# Patient Record
Sex: Female | Born: 1970 | Race: White | Hispanic: No | Marital: Married | Smoking: Never smoker
Health system: Southern US, Community
[De-identification: ages and names within clinical notes are randomized; demographics above are authoritative.]

## PROBLEM LIST (undated history)

## (undated) HISTORY — PX: OTHER SURGICAL HISTORY: SHX169

## (undated) HISTORY — PX: COSMETIC SURGERY: SHX468

---

## 2003-04-06 ENCOUNTER — Ambulatory Visit (HOSPITAL_COMMUNITY): Admission: RE | Admit: 2003-04-06 | Discharge: 2003-04-06 | Payer: Self-pay | Admitting: Obstetrics and Gynecology

## 2003-04-06 ENCOUNTER — Encounter: Payer: Self-pay | Admitting: Obstetrics and Gynecology

## 2003-06-29 ENCOUNTER — Encounter: Admission: RE | Admit: 2003-06-29 | Discharge: 2003-06-29 | Payer: Self-pay | Admitting: Obstetrics and Gynecology

## 2003-06-30 ENCOUNTER — Inpatient Hospital Stay (HOSPITAL_COMMUNITY): Admission: AD | Admit: 2003-06-30 | Discharge: 2003-07-02 | Payer: Self-pay | Admitting: Obstetrics and Gynecology

## 2003-07-12 ENCOUNTER — Encounter: Admission: RE | Admit: 2003-07-12 | Discharge: 2003-08-11 | Payer: Self-pay | Admitting: Obstetrics and Gynecology

## 2003-08-08 ENCOUNTER — Other Ambulatory Visit: Admission: RE | Admit: 2003-08-08 | Discharge: 2003-08-08 | Payer: Self-pay | Admitting: Obstetrics and Gynecology

## 2003-08-12 ENCOUNTER — Encounter: Admission: RE | Admit: 2003-08-12 | Discharge: 2003-09-11 | Payer: Self-pay | Admitting: Obstetrics and Gynecology

## 2003-11-12 ENCOUNTER — Encounter: Admission: RE | Admit: 2003-11-12 | Discharge: 2003-12-12 | Payer: Self-pay | Admitting: Obstetrics and Gynecology

## 2010-03-30 ENCOUNTER — Ambulatory Visit: Payer: Self-pay | Admitting: Emergency Medicine

## 2010-03-30 DIAGNOSIS — J45909 Unspecified asthma, uncomplicated: Secondary | ICD-10-CM | POA: Insufficient documentation

## 2010-03-30 DIAGNOSIS — IMO0002 Reserved for concepts with insufficient information to code with codable children: Secondary | ICD-10-CM

## 2010-04-04 ENCOUNTER — Ambulatory Visit: Payer: Self-pay | Admitting: Emergency Medicine

## 2010-04-04 DIAGNOSIS — L255 Unspecified contact dermatitis due to plants, except food: Secondary | ICD-10-CM

## 2010-10-30 NOTE — Assessment & Plan Note (Signed)
Summary: SKIN INFECTION/KH   Vital Signs:  Patient Profile:   40 Years Old Female CC:      infections to bilateral FAs X 11 days Height:     66 inches Weight:      186 pounds O2 Sat:      99 % O2 treatment:    Room Air Temp:     98.0 degrees F oral Pulse rate:   75 / minute Resp:     14 per minute BP sitting:   118 / 60  (left arm) Cuff size:   regular  Vitals Entered By: Lajean Saver RN (March 30, 2010 9:02 AM)                  Updated Prior Medication List: FLUOXETINE HCL 20 MG CAPS (FLUOXETINE HCL)   Current Allergies: ! PCNHistory of Present Illness Chief Complaint: infections to bilateral FAs X 11 days History of Present Illness: Infection to both forearms for 1-2 weeks.  Was out in her yard gardening and then this started on her right arms, then recently spread to left arm.  ?poison ivy.  Very itchy, red, swollen. OTC benedryl helps.  OTC itch creams help.  REVIEW OF SYSTEMS Constitutional Symptoms      Denies fever, chills, night sweats, weight loss, weight gain, and fatigue.  Eyes       Denies change in vision, eye pain, eye discharge, glasses, contact lenses, and eye surgery. Ear/Nose/Throat/Mouth       Denies hearing loss/aids, change in hearing, ear pain, ear discharge, dizziness, frequent runny nose, frequent nose bleeds, sinus problems, sore throat, hoarseness, and tooth pain or bleeding.  Respiratory       Denies dry cough, productive cough, wheezing, shortness of breath, asthma, bronchitis, and emphysema/COPD.  Cardiovascular       Denies murmurs, chest pain, and tires easily with exhertion.    Gastrointestinal       Denies stomach pain, nausea/vomiting, diarrhea, constipation, blood in bowel movements, and indigestion. Genitourniary       Denies painful urination, kidney stones, and loss of urinary control. Neurological       Complains of headaches.      Denies paralysis, seizures, and fainting/blackouts.      Comments: started  today Musculoskeletal       Denies muscle pain, joint pain, joint stiffness, decreased range of motion, redness, swelling, muscle weakness, and gout.  Skin       Complains of unusual moles/lumps or sores.      Denies bruising and hair/skin or nail changes.      Comments: bilateral forearms Psych       Denies mood changes, temper/anger issues, anxiety/stress, speech problems, depression, and sleep problems. Other Comments: patient noticed places to forearms after working in her yard about 11 days ago. 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  Left forearm erythema in 2  spots about 2-3cm each. Assessment New Problems: CELLULITIS, ARM (ICD-682.3) FAMILY HISTORY DIABETES 1ST DEGREE RELATIVE (ICD-V18.0) FAMILY HISTORY OF COLON CA 1ST DEGREE RELATIVE <60 (ICD-V16.0) ASTHMA (ICD-493.90)   Plan New Orders: New Patient Level III [99203] Planning Comments:   Rx for Bactrim DS by mouth two times a day for 10 days, no refills   The patient and/or caregiver has been counseled thoroughly with regard to medications prescribed including dosage, schedule, interactions, rationale for use, and possible side effects and they verbalize understanding.  Diagnoses and expected course of recovery  discussed and will return if not improved as expected or if the condition worsens. Patient and/or caregiver verbalized understanding.   Patient Instructions: 1)  Keep clean and dry 2)  Take antibiotics 3)  Can use topical benedryl for itch or mild steroid cream 4)  If worsening or fever, return to clinic  Orders Added: 1)  New Patient Level III [16109]

## 2010-10-30 NOTE — Assessment & Plan Note (Signed)
Summary: Rash is not getting any better rm 3   Vital Signs:  Patient Profile:   40 Years Old Female CC:      Rash - Pt states is getting no better Height:     66 inches Weight:      187 pounds O2 Sat:      100 % O2 treatment:    Room Air Temp:     97.9 degrees F oral Pulse rate:   71 / minute Pulse rhythm:   regular Resp:     16 per minute BP sitting:   108 / 75  (right arm) Cuff size:   regular  Vitals Entered By: Areta Haber CMA (April 04, 2010 4:27 PM)                  Current Allergies: ! PCN     History of Present Illness Chief Complaint: Rash - Pt states is getting no better History of Present Illness: She is here to follow up on her bilateral forearm rashes.  She is taking the Bactrim and the redness is getting better, but the skin is still irritated and itchy.  Caladryl lotion helps.  She states that some parts are getting better but other parts aren't.  Current Problems: POISON IVY DERMATITIS (ICD-692.6) CELLULITIS, ARM (ICD-682.3) FAMILY HISTORY DIABETES 1ST DEGREE RELATIVE (ICD-V18.0) FAMILY HISTORY OF COLON CA 1ST DEGREE RELATIVE <60 (ICD-V16.0) ASTHMA (ICD-493.90)   Current Meds FLUOXETINE HCL 20 MG CAPS (FLUOXETINE HCL)  BACTRIM DS 800-160 MG TABS (SULFAMETHOXAZOLE-TRIMETHOPRIM) 1 tab by mouth two times a day for 10 days CLOBETASOL PROPIONATE 0.05 % CREA (CLOBETASOL PROPIONATE) apply to affected areas two times a day as needed for rash  REVIEW OF SYSTEMS Constitutional Symptoms      Denies fever, chills, night sweats, weight loss, weight gain, and fatigue.  Eyes       Denies change in vision, eye pain, eye discharge, glasses, contact lenses, and eye surgery. Ear/Nose/Throat/Mouth       Denies hearing loss/aids, change in hearing, ear pain, ear discharge, dizziness, frequent runny nose, frequent nose bleeds, sinus problems, sore throat, hoarseness, and tooth pain or bleeding.  Respiratory       Denies dry cough, productive cough, wheezing,  shortness of breath, asthma, bronchitis, and emphysema/COPD.  Cardiovascular       Denies murmurs, chest pain, and tires easily with exhertion.    Gastrointestinal       Denies stomach pain, nausea/vomiting, diarrhea, constipation, blood in bowel movements, and indigestion. Genitourniary       Denies painful urination, kidney stones, and loss of urinary control. Neurological       Denies paralysis, seizures, and fainting/blackouts. Musculoskeletal       Denies muscle pain, joint pain, joint stiffness, decreased range of motion, redness, swelling, muscle weakness, and gout.  Skin       Denies bruising, unusual mles/lumps or sores, and hair/skin or nail changes.      Comments: L arm is no better. Psych       Denies mood changes, temper/anger issues, anxiety/stress, speech problems, depression, and sleep problems. Other Comments: Pt states she is still having alot of itching. Pt does not have a PCP.   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  Left forearm erythema is gone but there are raised lesions c/w contact derm.  No signs of infection, but perhaps still continued  poison ivy new lesions. Assessment New Problems: POISON IVY DERMATITIS (ICD-692.6)   Plan New Medications/Changes: CLOBETASOL PROPIONATE 0.05 % CREA (CLOBETASOL PROPIONATE) apply to affected areas two times a day as needed for rash  #QS x2 wks x 0, 04/04/2010, Hoyt Koch MD  New Orders: Est. Patient Level II [phone]  The patient and/or caregiver has been counseled thoroughly with regard to medications prescribed including dosage, schedule, interactions, rationale for use, and possible side effects and they verbalize understanding.  Diagnoses and expected course of recovery discussed and will return if not improved as expected or if the condition worsens. Patient and/or caregiver verbalized understanding.  Prescriptions: CLOBETASOL PROPIONATE 0.05 % CREA (CLOBETASOL PROPIONATE) apply to affected areas two times a day as needed for rash  #QS x2 wks x 0   Entered and Authorized by:   Hoyt Koch MD   Signed by:   Hoyt Koch MD on 04/04/2010   Method used:   Printed then faxed to ...       [facility] [phone]* (retail)       [address] [place], [place]  [address]       Ph: [phone]       Fax: [phone]   RxID:   [id number]   Patient Instructions: 1)  Continue the antibiotics until they are gone 2)  Use the steroid cream to decrease the inflammation and redness and itching 3)  If not improving in 5-7 more days, make appt with dermatology (phone number given) 4)  Contact info given for PCP 5)  Cool compresses  Orders Added: 1)  Est. Patient Level II [35573]

## 2010-12-14 ENCOUNTER — Inpatient Hospital Stay (INDEPENDENT_AMBULATORY_CARE_PROVIDER_SITE_OTHER)
Admission: RE | Admit: 2010-12-14 | Discharge: 2010-12-14 | Disposition: A | Discharge status: Home / Self Care | Payer: BC Managed Care – PPO | Source: Ambulatory Visit | Attending: Emergency Medicine | Admitting: Emergency Medicine

## 2010-12-14 ENCOUNTER — Encounter: Payer: Self-pay | Admitting: Emergency Medicine

## 2010-12-14 DIAGNOSIS — J029 Acute pharyngitis, unspecified: Secondary | ICD-10-CM

## 2010-12-17 ENCOUNTER — Telehealth (INDEPENDENT_AMBULATORY_CARE_PROVIDER_SITE_OTHER): Payer: Self-pay | Admitting: *Deleted

## 2010-12-18 NOTE — Assessment & Plan Note (Signed)
Summary: POSSIBLE STREP   Vital Signs:  Patient Profile:   40 Years Old Female CC:      strep test, fatigue, ear pain Height:     66 inches Weight:      145 pounds O2 Sat:      98 % O2 treatment:    Room Air Temp:     99.4 degrees F oral Pulse rate:   78 / minute Resp:     14 per minute BP sitting:   99 / 65  (left arm) Cuff size:     regular  Vitals Entered By: Lajean Saver RN (December 14, 2010 3:33 PM)                  Updated Prior Medication List: FLUOXETINE HCL 20 MG CAPS (FLUOXETINE HCL)   Current Allergies (reviewed today): ! PCNHistory of Present Illness History from: patient Chief Complaint: strep test, fatigue, ear pain History of Present Illness: Pt complains of 1 days of sore throat and fatigue. Son has + strep. Rare np cough, No dyspnea. No chest pain. No wheezing.  No nausea No vomiting. ? mild fever, No chills  Denies chance of pregnancy  REVIEW OF SYSTEMS Constitutional Symptoms       Complains of fatigue.     Denies fever, chills, night sweats, weight loss, and weight gain.  Eyes       Denies change in vision, eye pain, eye discharge, glasses, contact lenses, and eye surgery. Ear/Nose/Throat/Mouth       Complains of ear pain.      Denies hearing loss/aids, change in hearing, ear discharge, dizziness, frequent runny nose, frequent nose bleeds, sinus problems, sore throat, hoarseness, and tooth pain or bleeding.  Respiratory       Complains of dry cough.      Denies productive cough, wheezing, shortness of breath, asthma, bronchitis, and emphysema/COPD.  Cardiovascular       Denies murmurs, chest pain, and tires easily with exhertion.    Gastrointestinal       Denies stomach pain, nausea/vomiting, diarrhea, constipation, blood in bowel movements, and indigestion. Genitourniary       Denies painful urination, kidney stones, and loss of urinary control. Neurological       Denies paralysis, seizures, and fainting/blackouts. Musculoskeletal   Denies muscle pain, joint pain, joint stiffness, decreased range of motion, redness, swelling, muscle weakness, and gout.  Skin       Denies bruising, unusual mles/lumps or sores, and hair/skin or nail changes.  Psych       Denies mood changes, temper/anger issues, anxiety/stress, speech problems, depression, and sleep problems. Other Comments: Patient would like to be tested for strep, her son is positive. She is feeling fatigues and c/o some ear pain. Denies sore throat but it does "feel tight"   Past History:  Past Medical History: Reviewed history from 03/30/2010 and no changes required. Asthma  Past Surgical History: Reviewed history from 03/30/2010 and no changes required. right knee 1994 instant tear ducts  Family History: Reviewed history from 03/30/2010 and no changes required. 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 Alcohol use-yes, 3/week Drug use-no Regular exercise-no Physical Exam General appearance: well developed, well nourished, no acute distress Head: normocephalic, atraumatic Eyes: conjunctivae and lids normal Ears: normal, no lesions or deformities Nasal: mucosa pink, nonedematous, no septal deviation, turbinates normal Oral/Pharynx: pharyngeal erythema with exudate, uvula midline without deviation Neck: supple,anterior lymphadenopathy present Chest/Lungs: no  rales, wheezes, or rhonchi bilateral, breath sounds equal without effort Heart: regular rate and  rhythm, no murmur Skin: no obvious rashes or lesions Assessment New Problems: ACUTE PHARYNGITIS (ICD-462)  By hx, suspect strep.  Patient Education: Patient and/or caregiver instructed in the following: rest, fluids, Tylenol prn.  Plan New Medications/Changes: ZITHROMAX Z-PAK 250 MG TABS (AZITHROMYCIN) take as directed  #1 Pack x 1, 12/14/2010, Lajean Manes MD  New Orders: Rapid Strep [16109] Est. Patient Level  III [99213] T-Culture, Throat [[id number]] Planning Comments:   rx options discussed. Risks, benefits, alternatives discussed. Pt voiced understanding and agreement. Pt prefers to start zpack now, and send off throat cx  Follow Up: Follow up in 2-3 days if no improvement  The patient and/or caregiver has been counseled thoroughly with regard to medications prescribed including dosage, schedule, interactions, rationale for use, and possible side effects and they verbalize understanding.  Diagnoses and expected course of recovery discussed and will return if not improved as expected or if the condition worsens. Patient and/or caregiver verbalized understanding.  Prescriptions: ZITHROMAX Z-PAK 250 MG TABS (AZITHROMYCIN) take as directed  #1 Pack x 1   Entered and Authorized by:   Lajean Manes MD   Signed by:   Lajean Manes MD on 12/14/2010   Method used:   Electronically to        [facility] [phone]* (retail)       [address]       [place], [place]  [address]       Ph: [phone]       Fax: [phone]   RxID:   [id number]   Orders Added: 1)  Rapid Strep [03474] 2)  Est. Patient Level III [25956] 3)  T-Culture, Throat [[id number]]    Laboratory Results  Date/Time Received: December 14, 2010 3:47 PM  Date/Time Reported: December 14, 2010 3:47 PM   Kit Test Internal QC: Negative   (Normal Range: Negative)   Appended Document: POSSIBLE STREP rapid strep: Negative

## 2010-12-27 NOTE — Progress Notes (Signed)
  Phone Note Outgoing Call Call back at [phone]   Call placed by: Lajean Saver RN,  December 17, 2010 11:08 AM Call placed to: Patient Summary of Call: Callback: Patient reports she is feeling better. Negative throat culture given.

## 2011-01-24 ENCOUNTER — Other Ambulatory Visit: Payer: Self-pay | Admitting: Obstetrics and Gynecology

## 2011-01-24 DIAGNOSIS — Z1231 Encounter for screening mammogram for malignant neoplasm of breast: Secondary | ICD-10-CM

## 2011-01-31 ENCOUNTER — Ambulatory Visit
Admission: RE | Admit: 2011-01-31 | Discharge: 2011-01-31 | Disposition: A | Discharge status: Home / Self Care | Payer: BC Managed Care – PPO | Source: Ambulatory Visit | Attending: Obstetrics and Gynecology | Admitting: Obstetrics and Gynecology

## 2011-01-31 DIAGNOSIS — Z1231 Encounter for screening mammogram for malignant neoplasm of breast: Secondary | ICD-10-CM

## 2011-02-15 NOTE — Discharge Summary (Signed)
NAME:  Jennifer Mathis, Jennifer Mathis                      ACCOUNT NO.:  [id number]   MEDICAL RECORD NO.:  [id number]                   PATIENT TYPE:  INP   LOCATION:  9113                                 FACILITY:  [facility]   PHYSICIAN:  Huel Cote, M.D.              DATE OF BIRTH:  Feb 28, 1971   DATE OF ADMISSION:  06/30/2003  DATE OF DISCHARGE:  07/02/2003                                 DISCHARGE SUMMARY   DISCHARGE DIAGNOSES:  1. Term pregnancy at 39 plus weeks delivered.  2. Status post vacuum extraction delivery.  3. Meconium stained fluid.   DISCHARGE MEDICATIONS:  1. Motrin 600 mg p.o. every 6 hours p.r.n.  2. Percocet 1 to 2 tablets p.o. every 4 hours p.r.n.   FOLLOW UP:  The patient is to followup in 6 weeks for her routine postpartum  examination.   HISTORY OF PRESENT ILLNESS:  The patient is a 40 year old G1, P0 who was  admitted at 81 plus weeks in labor. Her prenatal care had been complicated  by ASK [place] pap smear with HPV positive. She did have a colposcopy performed  at her prior physician in [place], which was essentially negative.   PRENATAL LABORATORY DATA:  A positive, antibody negative. RPR nonreactive,  rubella immune, hepatitis B surface antigen negative. HIV negative. GC  negative. Chlamydia negative. One hour Glucola 108. Group B strep negative.   PRENATAL COURSE:  The patient had a good prenatal course except for a  thrombosed hemorrhoid noted at approximately [redacted] weeks gestation, for which  she saw general surgery and had it removed.   PAST MEDICAL HISTORY:  None.   PAST SURGICAL HISTORY:  Tear duct surgery and knee surgery.   ALLERGIES:  AMOXICILLIN AND HALOTHANE GAS.   GYNECOLOGIC HISTORY:  History of ASKS pap smear as stated. Will be followed  up postpartum.   PHYSICAL EXAMINATION:  VITAL SIGNS: Afebrile with stable vital signs.  HEART/LUNGS: Clear.  ABDOMEN: Soft, nontender.  PELVIC: Cervix was 4 to 5 cm, 90% effaced and minus 2 station with  meconium  stained fluid noted.   HOSPITAL COURSE:  She continued to progress and had an IU PC placed given  the meconium stained fluid. However, this was not felt thick enough to  proceed with amnion infusion. The patient progressed very quickly to  complete and pushed well. At the time of delivery, fetal heart tone had a  persistent bradycardia of 90 to 100 with vertex at a +3. Local block was  then applied and M-cup vacuum was applied with the vertex brought to the  peritoneum in 2 pulls and vacuum removed. The patient pushed out the  remained of the infant with a viable female infant delivered. Apgar's were 8  and 9. Weight was 6 pounds and 14 ounces. The infant was bulb suctioned on  the perineum with moderate to thick meconium noted. Dr. Alison Murray was present  at delivery and noted no big  meconium below the cords. Secondary laceration  was repaired with 3-0 Vicryl. Estimated blood loss was approximately 500 cc.  The patient did very well and on postpartum day 2, physically was felt  stable for discharge. Her hemoglobin postpartum was 11.7 and her lochia was  normal. There was 1 incident that the patient reported on the day of  discharge, where a nursery staff member brought the wrong infant out in her  isolette. The patient became aware immediately that this was not her infant  and the staff checked arm bands and corrected the problems by taking the  baby back to the nursery with the patient's husband. The patient's baby was  still in the nursery in staff members arms and had not been placed in an  inappropriate bed. Nursing for the patient did notify house coverage, who  will discuss this with the patient and reassure her regarding safety issues  prior to discharge.                                               Huel Cote, M.D.    KR/MEDQ  D:  07/02/2003  T:  07/03/2003  Job:  [id number]

## 2011-07-26 ENCOUNTER — Encounter (INDEPENDENT_AMBULATORY_CARE_PROVIDER_SITE_OTHER): Payer: Self-pay | Admitting: Surgery

## 2011-07-26 ENCOUNTER — Ambulatory Visit (INDEPENDENT_AMBULATORY_CARE_PROVIDER_SITE_OTHER): Payer: BC Managed Care – PPO | Admitting: Surgery

## 2011-07-26 ENCOUNTER — Encounter (INDEPENDENT_AMBULATORY_CARE_PROVIDER_SITE_OTHER): Payer: BC Managed Care – PPO | Admitting: General Surgery

## 2011-07-26 VITALS — BP 99/61 | HR 71 | Temp 99.4°F | Resp 16 | Ht 66.0 in | Wt 144.0 lb

## 2011-07-26 DIAGNOSIS — K648 Other hemorrhoids: Secondary | ICD-10-CM

## 2011-07-26 MED ORDER — HYDROCORTISONE ACE-PRAMOXINE 2.5-1 % RE CREA: TOPICAL_CREAM | Freq: Four times a day (QID) | RECTAL | Status: AC

## 2011-07-26 NOTE — Patient Instructions (Signed)

## 2011-07-26 NOTE — Progress Notes (Signed)
Subjective:     Patient ID: Jennifer Mathis, female   DOB: 1971-08-18, 40 y.o.   MRN: [id number]  HPI  Patient Care Team: Zenaida Niece, MD as PCP - General (Obstetrics and Gynecology) Lonell Face, MD as Attending Physician (Family Medicine)  This patient is a 40 y.o.female who presents today for surgical evaluation.   Reason for visit: Irritated hemorrhoids.  Patient is a pleasant healthy female who has intermittent hemorrhoid flares since her 2 pregnancies. Her children's ages are now 15 and 8. She had had episiotomies for the vaginal deliveries. She does not recall any injury to the sphincter. No issues of incontinence to flatus, stool, urine. She usually tries Preparation H for flares. They only mildly helped.  Over the past few months she's noticed that she's had more frequent flares. She often gets it when she gets some loose stools. She normally has a BM every day. Her stomach is sensitive to heavy & greasier food so she tries to avoid them & sticks to a  whole food diet.   Both her grandmothers were diagnosed with colon cancer in their 44s and 68s. The patient's never had a colonoscopy.  She had a more recent flare last week and called for an appointment a few days ago. However now she is feeling better. She's never really had any rectal bleeding. No severe pain with defecation. More of a burning and irritation. She notes her husband has really bad hemorrhoids and wonders if he needs to come in as well.  Past Medical History  Diagnosis Date  . Asthma     Past Surgical History  Procedure Date  . Knee surgery   . Cosmetic surgery     History   Social History  . Marital Status: Married    Spouse Name: N/A    Number of Children: N/A  . Years of Education: N/A   Occupational History  . Not on file.   Social History Main Topics  . Smoking status: Never Smoker   . Smokeless tobacco: Not on file  . Alcohol Use: Yes  . Drug Use: No  . Sexually Active: Not on  file   Other Topics Concern  . Not on file   Social History Narrative  . No narrative on file    No family history on file.  Current outpatient prescriptions:FLUoxetine (PROZAC) 10 MG capsule, , Disp: , Rfl: ;  hydrocortisone-pramoxine (ANALPRAM-HC) 2.5-1 % rectal cream, Place rectally 4 (four) times daily. For irritated and painful hemorrhoids, Disp: 15 g, Rfl: 2  Allergies  Allergen Reactions  . Penicillins     REACTION: Rash       Review of Systems  Constitutional: Negative for fever, chills, diaphoresis, appetite change and fatigue.  HENT: Negative for ear pain, sore throat, trouble swallowing, neck pain and ear discharge.   Eyes: Negative for photophobia, discharge and visual disturbance.  Respiratory: Negative for cough, choking, chest tightness and shortness of breath.   Cardiovascular: Negative for chest pain and palpitations.  Gastrointestinal: Positive for rectal pain. Negative for nausea, vomiting, abdominal pain, diarrhea, constipation, blood in stool, abdominal distention and anal bleeding.       No incontinence to flatus/stool.   Genitourinary: Negative for dysuria, frequency, decreased urine volume, vaginal bleeding, difficulty urinating and menstrual problem.       No urinary incontinence.  ?Prior bladder spasms - none now.  Musculoskeletal: Negative for myalgias, arthralgias and gait problem.  Skin: Positive for wound. Negative for color change, pallor  and rash.  Neurological: Negative for dizziness, speech difficulty, weakness and numbness.  Hematological: Negative for adenopathy.  Psychiatric/Behavioral: Negative for confusion and agitation. The patient is not nervous/anxious.        Objective:   Physical Exam  Constitutional: She is oriented to person, place, and time. She appears well-developed and well-nourished. No distress.  HENT:  Head: Normocephalic.  Mouth/Throat: Oropharynx is clear and moist. No oropharyngeal exudate.  Eyes: Conjunctivae and  EOM are normal. Pupils are equal, round, and reactive to light. No scleral icterus.  Neck: Normal range of motion. Neck supple. No tracheal deviation present.  Cardiovascular: Normal rate, regular rhythm and intact distal pulses.   Pulmonary/Chest: Effort normal and breath sounds normal. No respiratory distress. She exhibits no tenderness.  Abdominal: Soft. She exhibits no distension and no mass. There is no tenderness. There is no rebound and no guarding. Hernia confirmed negative in the right inguinal area and confirmed negative in the left inguinal area.  Genitourinary: Vagina normal. No vaginal discharge found.       Perianal skin clean with good hygiene.  No pruritis.  Normal sphincter tone.  No fissure.  No abscess/fistula.  No pilonidal disease.    Tolerates digital and anoscopic rectal exam.  No rectal masses.    Small R ant hemorrhoid.  No bleeding/prolapse.  R post and L lat hemorrhoidal piles WNL   Musculoskeletal: Normal range of motion. She exhibits no tenderness.  Lymphadenopathy:    She has no cervical adenopathy.       Right: No inguinal adenopathy present.       Left: No inguinal adenopathy present.  Neurological: She is alert and oriented to person, place, and time. No cranial nerve deficit. She exhibits normal muscle tone. Coordination normal.  Skin: Skin is warm and dry. No rash noted. She is not diaphoretic. No erythema.  Psychiatric: She has a normal mood and affect. Her behavior is normal. Judgment and thought content normal.       Assessment:     Hemorrhoids with mild inflammation     Plan:     The anatomy & physiology of the anorectal region was discussed.  The pathophysiology of hemorrhoids and differential diagnosis was discussed.  Natural history progression  was discussed.   I stressed the importance of a bowel regimen to have daily soft bowel movements to minimize progression of disease.   Use of wet wipes, warm baths, avoiding straining, etc were  emphasized.  Analpram Rx given as well PRN next flare.  Educational handouts further explaining the pathology, treatment options, and bowel regimen were given as well.   The patient expressed understanding.  She might benefit from injection to prevent further flares, but she declined for now.  She feels reassured & will call if she gets another flare or reconsiders.  She will probably send her husband as well.  He also has h/o colon polyps 5 yrs ago.  I recommended he get a colonoscopy now to make sure he does not have any new polyps.

## 2017-09-29 ENCOUNTER — Other Ambulatory Visit: Payer: Self-pay | Admitting: Obstetrics and Gynecology

## 2017-09-29 DIAGNOSIS — R928 Other abnormal and inconclusive findings on diagnostic imaging of breast: Secondary | ICD-10-CM

## 2017-10-03 ENCOUNTER — Other Ambulatory Visit: Payer: Self-pay | Admitting: Obstetrics and Gynecology

## 2017-10-03 ENCOUNTER — Ambulatory Visit
Admission: RE | Admit: 2017-10-03 | Discharge: 2017-10-03 | Disposition: A | Discharge status: Home / Self Care | Payer: BLUE CROSS/BLUE SHIELD | Source: Ambulatory Visit | Attending: Obstetrics and Gynecology | Admitting: Obstetrics and Gynecology

## 2017-10-03 DIAGNOSIS — R928 Other abnormal and inconclusive findings on diagnostic imaging of breast: Secondary | ICD-10-CM

## 2017-10-03 DIAGNOSIS — N6489 Other specified disorders of breast: Secondary | ICD-10-CM

## 2017-10-08 ENCOUNTER — Other Ambulatory Visit: Payer: Self-pay | Admitting: Obstetrics and Gynecology

## 2017-10-08 ENCOUNTER — Ambulatory Visit
Admission: RE | Admit: 2017-10-08 | Discharge: 2017-10-08 | Disposition: A | Discharge status: Home / Self Care | Payer: BLUE CROSS/BLUE SHIELD | Source: Ambulatory Visit | Attending: Obstetrics and Gynecology | Admitting: Obstetrics and Gynecology

## 2017-10-08 DIAGNOSIS — N6489 Other specified disorders of breast: Secondary | ICD-10-CM

## 2018-03-13 ENCOUNTER — Other Ambulatory Visit: Payer: Self-pay | Admitting: Obstetrics and Gynecology

## 2018-03-13 DIAGNOSIS — N6489 Other specified disorders of breast: Secondary | ICD-10-CM

## 2018-04-10 ENCOUNTER — Other Ambulatory Visit: Payer: Self-pay | Admitting: Obstetrics and Gynecology

## 2018-04-10 ENCOUNTER — Ambulatory Visit
Admission: RE | Admit: 2018-04-10 | Discharge: 2018-04-10 | Disposition: A | Discharge status: Home / Self Care | Payer: BLUE CROSS/BLUE SHIELD | Source: Ambulatory Visit | Attending: Obstetrics and Gynecology | Admitting: Obstetrics and Gynecology

## 2018-04-10 DIAGNOSIS — N6489 Other specified disorders of breast: Secondary | ICD-10-CM

## 2018-09-17 ENCOUNTER — Ambulatory Visit (INDEPENDENT_AMBULATORY_CARE_PROVIDER_SITE_OTHER): Payer: BLUE CROSS/BLUE SHIELD | Admitting: Physician Assistant

## 2018-09-17 ENCOUNTER — Ambulatory Visit (INDEPENDENT_AMBULATORY_CARE_PROVIDER_SITE_OTHER): Payer: Self-pay

## 2018-09-17 ENCOUNTER — Encounter (INDEPENDENT_AMBULATORY_CARE_PROVIDER_SITE_OTHER): Payer: Self-pay | Admitting: Physician Assistant

## 2018-09-17 VITALS — Ht 66.0 in | Wt 144.0 lb

## 2018-09-17 DIAGNOSIS — M79672 Pain in left foot: Secondary | ICD-10-CM | POA: Diagnosis not present

## 2018-09-17 DIAGNOSIS — M21619 Bunion of unspecified foot: Secondary | ICD-10-CM

## 2018-09-17 DIAGNOSIS — M6702 Short Achilles tendon (acquired), left ankle: Secondary | ICD-10-CM

## 2018-09-17 DIAGNOSIS — M205X9 Other deformities of toe(s) (acquired), unspecified foot: Secondary | ICD-10-CM | POA: Diagnosis not present

## 2018-09-17 DIAGNOSIS — M6701 Short Achilles tendon (acquired), right ankle: Secondary | ICD-10-CM

## 2018-09-17 DIAGNOSIS — M79671 Pain in right foot: Secondary | ICD-10-CM

## 2018-09-18 ENCOUNTER — Encounter (INDEPENDENT_AMBULATORY_CARE_PROVIDER_SITE_OTHER): Payer: Self-pay | Admitting: Physician Assistant

## 2018-09-18 NOTE — Progress Notes (Signed)
Office Visit Note   Patient: Jennifer Mathis           Date of Birth: 08/01/1971           MRN: [id number] Visit Date: 09/17/2018              Requested by: Lavina HammanMeisinger, Todd, MD [address], [address] [place], [place] [id number] PCP: Lavina HammanMeisinger, Todd, MD  Chief Complaint  Patient presents with  . Left Foot - Pain  . Right Foot - Pain      HPI: Patient is a 47 year old woman who complains of bunion pain worse on the left than the right she also has pain beneath the second metatarsal head also worse on the left than the right.  She states that with dress shoes she has more pain she does wear a gel cushion spacer in the first webspace which she states does not help.  Assessment & Plan: Visit Diagnoses:  1. Pain in both feet   2. Bunion of great toe   3. Acquired claw toe, unspecified laterality     Plan: Recommended she proceed with a stiff soled Trail running shoe such as Hoka as well as orthotics to unload pressure from the metatarsal heads with a sole orthotic.  Recommended Achilles stretching and this was demonstrated to her.  Discussed that if she fails conservative therapy we could proceed with a chevron osteotomy of the first metatarsal of the left foot and a Weil osteotomy for the second metatarsal of the left foot.  Follow-Up Instructions: Return if symptoms worsen or fail to improve.   Ortho Exam  Patient is alert, oriented, no adenopathy, well-dressed, normal affect, normal respiratory effort. Examination patient has good pulses bilaterally.  She does have a heel cord tightness with dorsiflexion only to neutral with her knee extended.  Patient does have a prominent bunion eminence medially of the left toe greater than right toe and these are tender to palpation.  She has a prominent long second metatarsal worse on the left than the right and the metatarsal head is tender to palpation.  She has mild clawing which is not fixed of the second toe worse on the left than  the right.  Imaging: No results found. No images are attached to the encounter.  Labs: No results found for: HGBA1C, ESRSEDRATE, CRP, LABURIC, REPTSTATUS, GRAMSTAIN, CULT, LABORGA   No results found for: ALBUMIN, PREALBUMIN, LABURIC  Body mass index is 23.24 kg/m.  Orders:  Orders Placed This Encounter  Procedures  . XR Foot 2 Views Left  . XR Foot 2 Views Right   No orders of the defined types were placed in this encounter.    Procedures: No procedures performed  Clinical Data: No additional findings.  ROS:  All other systems negative, except as noted in the HPI. Review of Systems  Objective: Vital Signs: Ht 5\' 6"  (1.676 m)   Wt 144 lb (65.3 kg)   BMI 23.24 kg/m   Specialty Comments:  No specialty comments available.  PMFS History: Patient Active Problem List   Diagnosis Date Noted  . Hemorrhoids, internal with irritation/pain 07/26/2011  . ACUTE PHARYNGITIS 12/14/2010  . 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 ASTHMA 03/30/2010  . CELLULITIS, ARM 03/30/2010   Past Medical History:  Diagnosis Date  . Asthma     History reviewed. No pertinent family history.  Past Surgical History:  Procedure Laterality Date  . COSMETIC SURGERY    . episiotomy x 2  with each pregnancy.  No sphincter breach  . knee surgery     Social History   Occupational History  . Not on file  Tobacco Use  . Smoking status: Never Smoker  . Smokeless tobacco: Never Used  Substance and Sexual Activity  . Alcohol use: Yes  . Drug use: No  . Sexual activity: Not Currently

## 2020-07-25 ENCOUNTER — Other Ambulatory Visit: Payer: Self-pay | Admitting: Physician Assistant

## 2020-07-25 DIAGNOSIS — K769 Liver disease, unspecified: Secondary | ICD-10-CM

## 2020-07-29 ENCOUNTER — Other Ambulatory Visit: Payer: Self-pay

## 2020-07-29 ENCOUNTER — Ambulatory Visit
Admission: RE | Admit: 2020-07-29 | Discharge: 2020-07-29 | Disposition: A | Discharge status: Home / Self Care | Payer: BC Managed Care – PPO | Source: Ambulatory Visit | Attending: Physician Assistant | Admitting: Physician Assistant

## 2020-07-29 DIAGNOSIS — K769 Liver disease, unspecified: Secondary | ICD-10-CM

## 2020-07-29 MED ORDER — GADOBENATE DIMEGLUMINE 529 MG/ML IV SOLN
14.0000 mL | Freq: Once | INTRAVENOUS | Status: AC | PRN
  Administered 2020-07-29: 14 mL via INTRAVENOUS

## 2021-11-06 ENCOUNTER — Other Ambulatory Visit: Payer: Self-pay | Admitting: Family Medicine

## 2021-11-06 DIAGNOSIS — K769 Liver disease, unspecified: Secondary | ICD-10-CM

## 2021-12-16 ENCOUNTER — Other Ambulatory Visit: Payer: BC Managed Care – PPO

## 2022-02-23 IMAGING — MR MR ABDOMEN WO/W CM
17 series · 48 of 48 positions shown · IV contrast (14ml Multihance)
Comparison: CT abdomen pelvis, 06/13/2020

CLINICAL DATA: Characterize liver lesions identified by prior CT

EXAM:
MRI ABDOMEN WITHOUT AND WITH CONTRAST
TECHNIQUE: Multiplanar multisequence MR imaging of the abdomen was performed
both before and after the administration of intravenous contrast.
CONTRAST:  14mL MULTIHANCE GADOBENATE DIMEGLUMINE 529 MG/ML IV SOLN

[Series 3: T2 · coronal · 5.0mm · 1.56mm/px · 1 of 36 slices shown (1 of 3)]
[im 1/36]
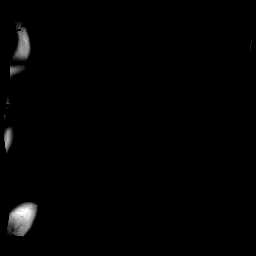

[Series 4: T1 · axial · 3.0mm · 1.19mm/px · z∈[-45,+168]mm · 5 of 144 slices shown]
[im 1/144]
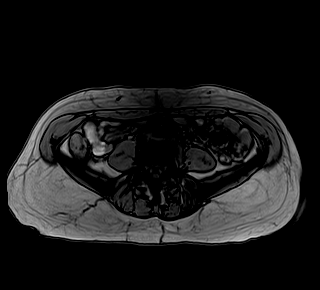
[im 36/144]
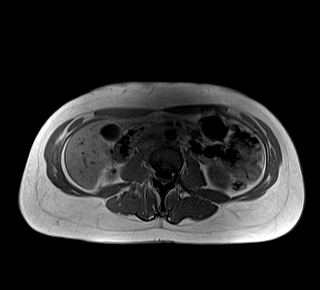
[im 72/144]
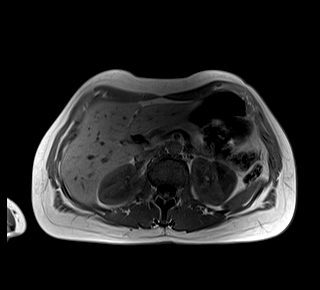
[im 108/144]
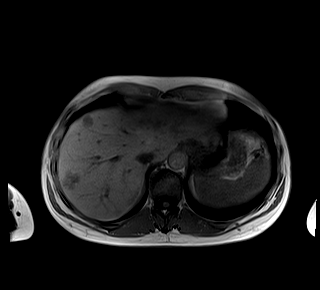
[im 144/144]
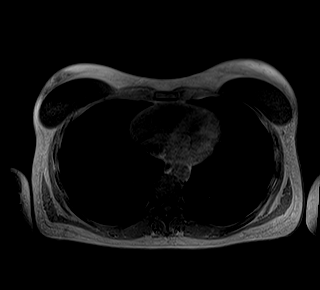

[Series 5: bSSFP · axial · 5.0mm · 1.25mm/px · z∈[-49,+173]mm · 2 of 38 slices shown]
[im 1/38]
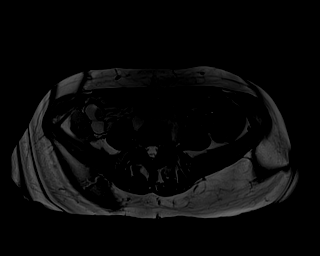
[im 38/38]
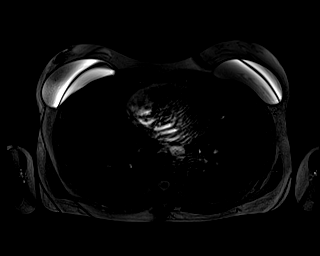

[Series 6: T2 · axial · 5.0mm · 1.48mm/px · z∈[-31,+191]mm · 2 of 38 slices shown (2 of 3)]
[im 1/38]
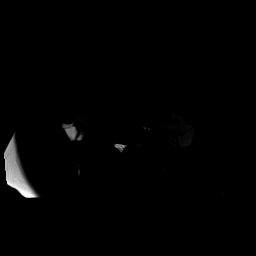
[im 38/38]
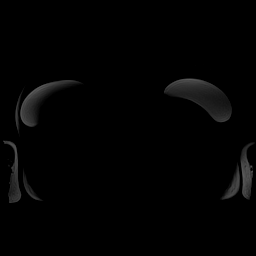

[Series 7: DWI · axial · 5.0mm · 1.42mm/px · z∈[-20,+190]mm · 5 of 108 slices shown (1 of 2)]
[im 1/108]
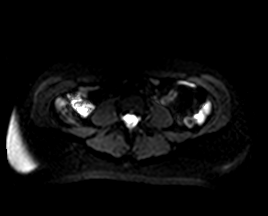
[im 27/108]
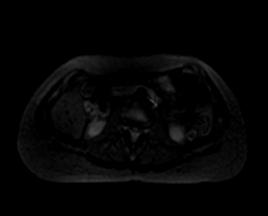
[im 54/108]
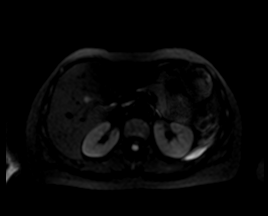
[im 81/108]
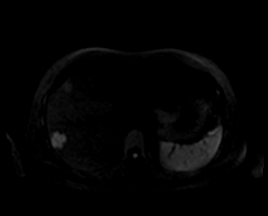
[im 108/108]
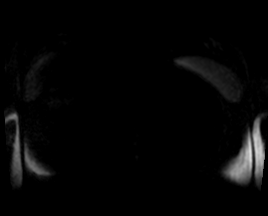

[Series 8: DWI · axial · 5.0mm · 1.42mm/px · z∈[-20,+190]mm · 2 of 36 slices shown (2 of 2)]
[im 1/36]
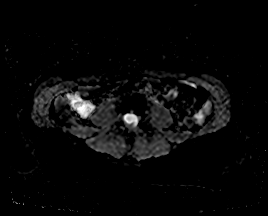
[im 36/36]
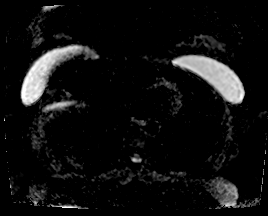

[Series 9: T2 · axial · 6.0mm · 1.19mm/px · 1 of 30 slices shown (3 of 3)]
[im 1/30]
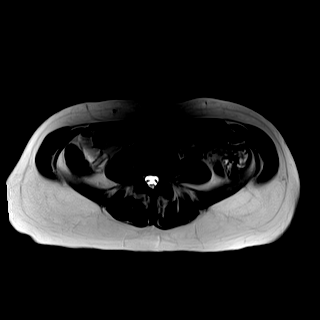

[Series 10: T1 dynamic · axial · non-contrast · 3.0mm · 1.25mm/px · z∈[-42,+171]mm · 3 of 72 slices shown]
[im 1/72]
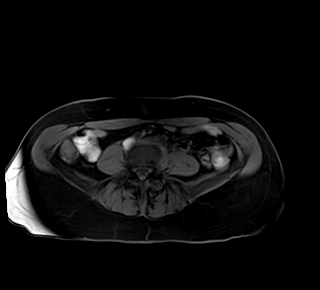
[im 36/72]
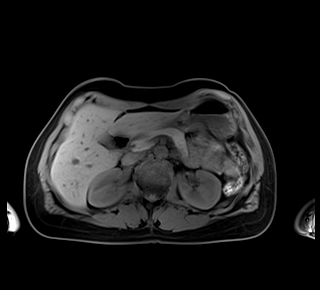
[im 72/72]
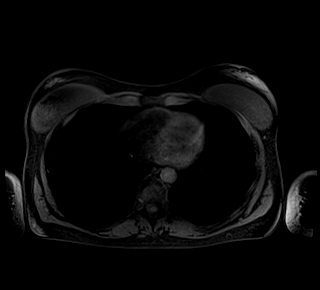

[Series 11: T1 dynamic post-contrast · axial · 3.0mm · 1.25mm/px · z∈[-42,+171]mm · 3 of 72 slices shown (1 of 9)]
[im 1/72]
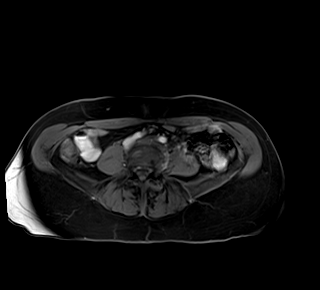
[im 36/72]
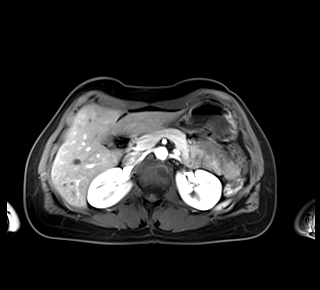
[im 72/72]
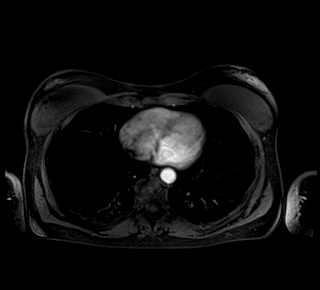

[Series 12: T1 dynamic post-contrast · axial · 3.0mm · 1.25mm/px · z∈[-42,+171]mm · 3 of 72 slices shown (2 of 9)]
[im 1/72]
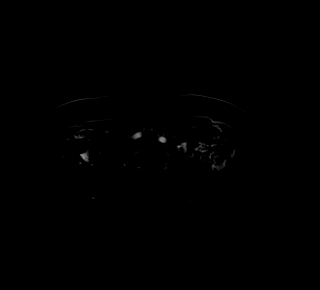
[im 36/72]
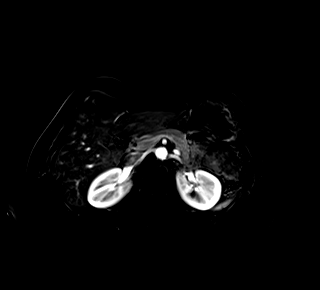
[im 72/72]
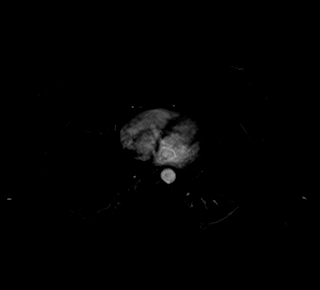

[Series 13: T1 dynamic post-contrast · axial · 3.0mm · 1.25mm/px · z∈[-42,+171]mm · 3 of 72 slices shown (3 of 9)]
[im 1/72]
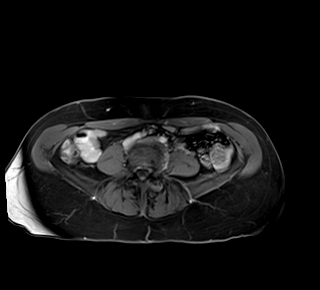
[im 36/72]
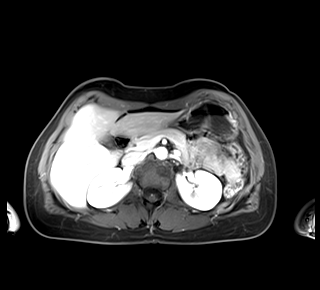
[im 72/72]
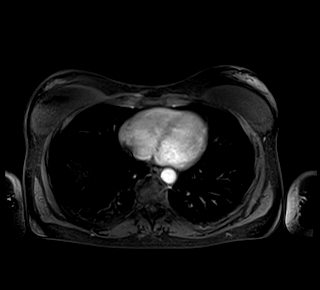

[Series 14: T1 dynamic post-contrast · axial · 3.0mm · 1.25mm/px · z∈[-42,+171]mm · 3 of 72 slices shown (4 of 9)]
[im 1/72]
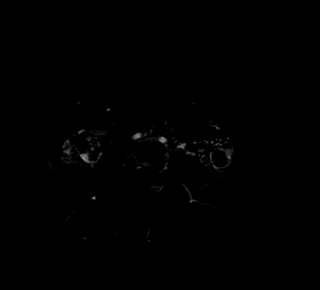
[im 36/72]
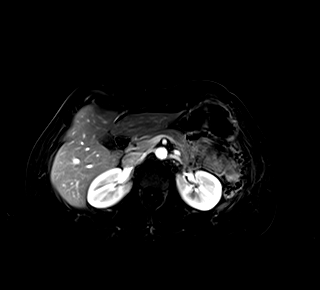
[im 72/72]
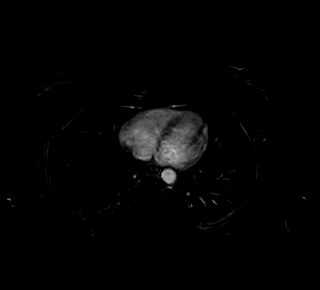

[Series 15: T1 dynamic post-contrast · axial · 3.0mm · 1.25mm/px · z∈[-42,+171]mm · 3 of 72 slices shown (5 of 9)]
[im 1/72]
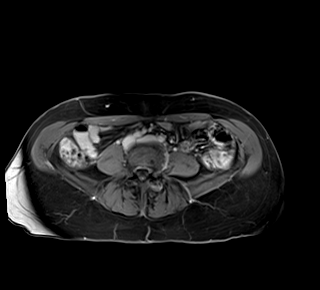
[im 36/72]
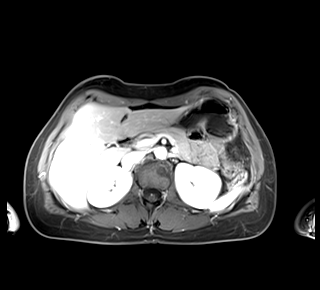
[im 72/72]
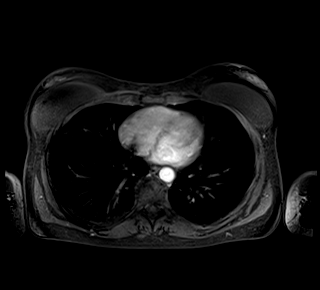

[Series 16: T1 dynamic post-contrast · axial · 3.0mm · 1.25mm/px · z∈[-42,+171]mm · 3 of 72 slices shown (6 of 9)]
[im 1/72]
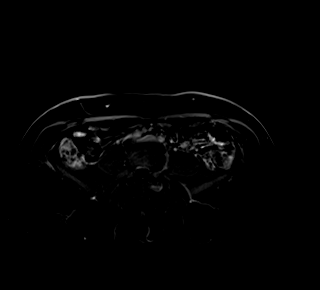
[im 36/72]
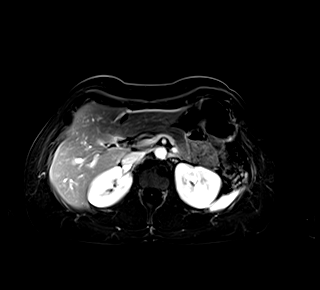
[im 72/72]
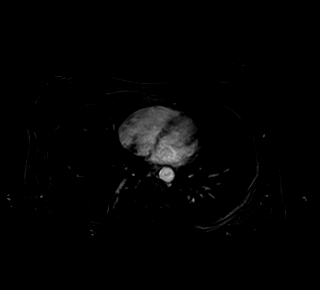

[Series 17: T1 dynamic post-contrast · coronal · 3.0mm · 1.25mm/px · 3 of 72 slices shown (7 of 9)]
[im 1/72]
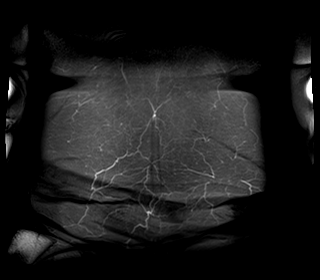
[im 36/72]
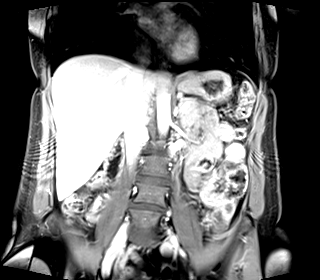
[im 72/72]
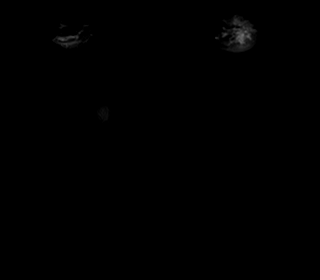

[Series 18: T1 dynamic post-contrast · axial · 3.0mm · 1.25mm/px · z∈[-42,+171]mm · 3 of 72 slices shown (8 of 9)]
[im 1/72]
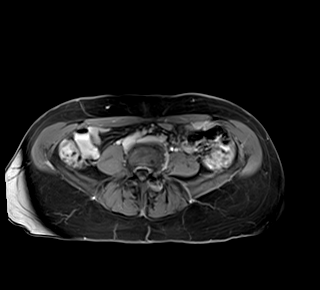
[im 36/72]
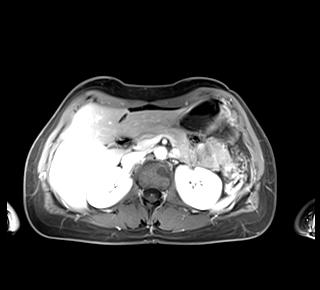
[im 72/72]
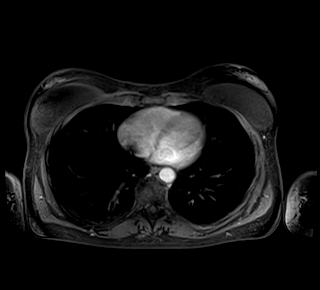

[Series 19: T1 dynamic post-contrast · axial · 3.0mm · 1.25mm/px · z∈[-42,+171]mm · 3 of 72 slices shown (9 of 9)]
[im 1/72]
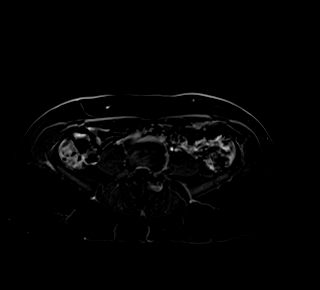
[im 36/72]
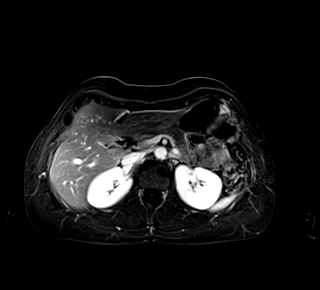
[im 72/72]
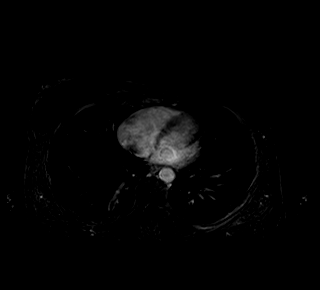

[48 of 48 positions shown; findings below may reference images not displayed]

FINDINGS: Lower chest: No acute findings.  Bilateral breast implants.

Hepatobiliary: There are six fluid signal lesions in the liver, all
but one located in the right lobe. There is a simple, nonenhancing
cyst of the medial left lobe of the liver measuring 1.3 cm (series
6, image 13). The remaining lesions all demonstrate progressive
discontinuous peripheral nodular enhancement on multiphasic contrast
enhanced sequences, consistent with benign hemangiomata. The largest
lesion is a subcapsular hemangioma of the lateral right lobe of the
liver, hepatic segment VII, measuring 2.5 x 2.1 cm (series 18, image
23). No solid mass or other parenchymal abnormality identified.

Pancreas: No mass, inflammatory changes, or other parenchymal
abnormality identified.

Spleen:  Within normal limits in size and appearance.

Adrenals/Urinary Tract: No masses identified. No evidence of
hydronephrosis.

Stomach/Bowel: Visualized portions within the abdomen are
unremarkable.

Vascular/Lymphatic: No pathologically enlarged lymph nodes
identified. No abdominal aortic aneurysm demonstrated.

Other:  None.

Musculoskeletal: No suspicious bone lesions identified.
IMPRESSION: 1. Multiple liver lesions demonstrating progressive discontinuous
peripheral nodular contrast enhancement on multiphasic contrast
enhanced imaging, consistent with small, definitively benign hepatic
hemangiomata. The largest lesion is a subcapsular hemangioma of the
lateral right lobe of the liver, hepatic segment VII, measuring
x 2.1 cm. No further characterization or follow-up is required.
2. There is an additional simple, nonenhancing benign cyst of the
medial left lobe of the liver.
